# Patient Record
Sex: Male | Born: 1960 | Race: White | Hispanic: No | Marital: Married
Health system: Southern US, Community
[De-identification: ages and names within clinical notes are randomized; demographics above are authoritative.]

---

## 2013-02-21 ENCOUNTER — Other Ambulatory Visit: Payer: Self-pay | Admitting: Family Medicine

## 2013-02-21 DIAGNOSIS — Z1211 Encounter for screening for malignant neoplasm of colon: Secondary | ICD-10-CM

## 2013-03-20 ENCOUNTER — Ambulatory Visit
Admission: RE | Admit: 2013-03-20 | Discharge: 2013-03-20 | Disposition: A | Discharge status: Home / Self Care | Payer: PRIVATE HEALTH INSURANCE | Source: Ambulatory Visit | Attending: Family Medicine | Admitting: Family Medicine

## 2013-03-20 DIAGNOSIS — Z1211 Encounter for screening for malignant neoplasm of colon: Secondary | ICD-10-CM

## 2015-03-07 IMAGING — CT CT VIRTUAL COLONOSCOPY SCREENING
3 of 7 series · 13 of 46 positions shown, 19 images · non-contrast
Comparison: None.

CLINICAL DATA: Colon cancer screening

EXAM:
CT VIRTUAL COLONOSCOPY SCREENING
TECHNIQUE: The patient was given a standard mag citrate bowel preparation with
Gastrografin and barium for fluid and stool tagging respectively.
The quality of the bowel preparation is moderate. Automated CO2
insufflation of the colon was performed prior to image acquisition
and colonic distention is excellent. Image post processing was used
to generate a 3D endoluminal fly-through projection of the colon and
to electronically subtract stool/fluid as appropriate.

[Series 5: prone (id) · axial · 0.70mm/px · z∈[-497,-97]mm · 9 of 402 slices shown, 15 images]
[im 41/402  soft-tissue]
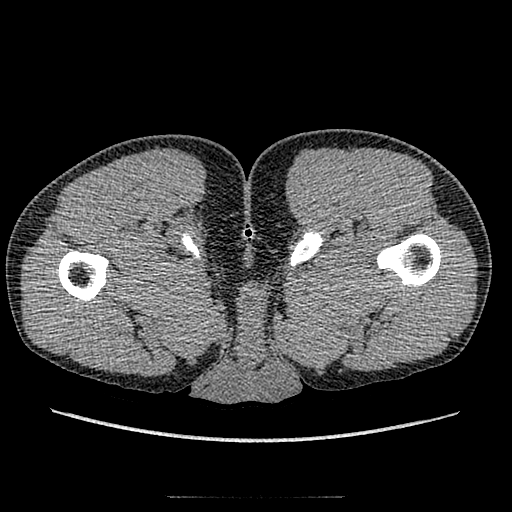
[im 41/402  bone]
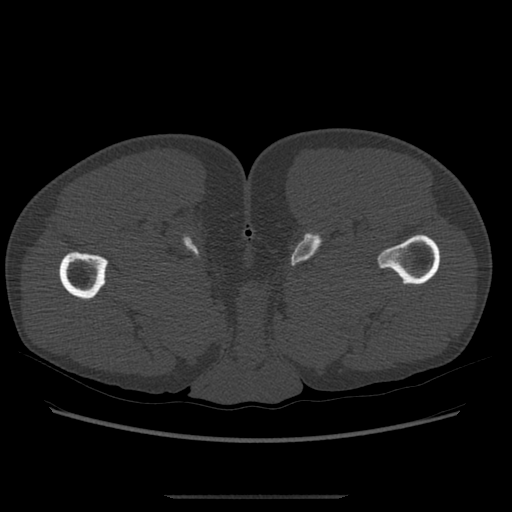
[im 81/402  soft-tissue]
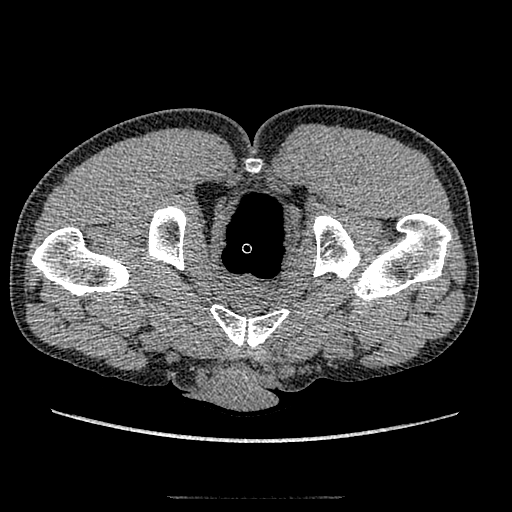
[im 121/402  soft-tissue]
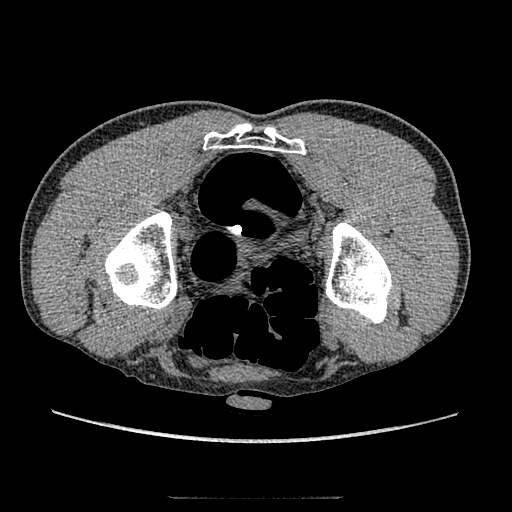
[im 161/402  soft-tissue]
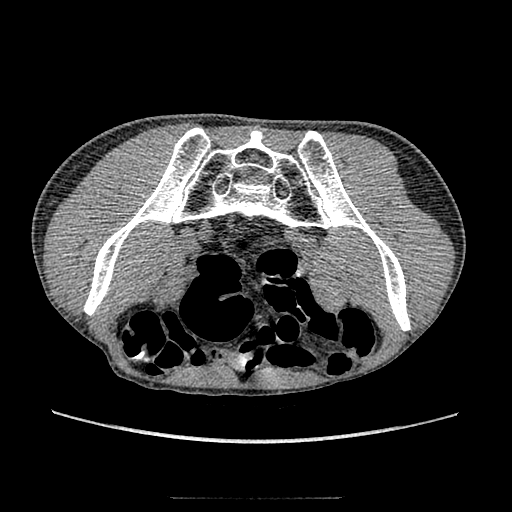
[im 201/402  soft-tissue]
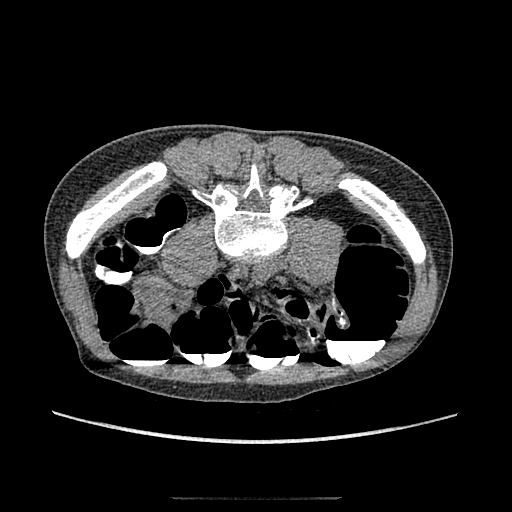
[im 241/402  soft-tissue]
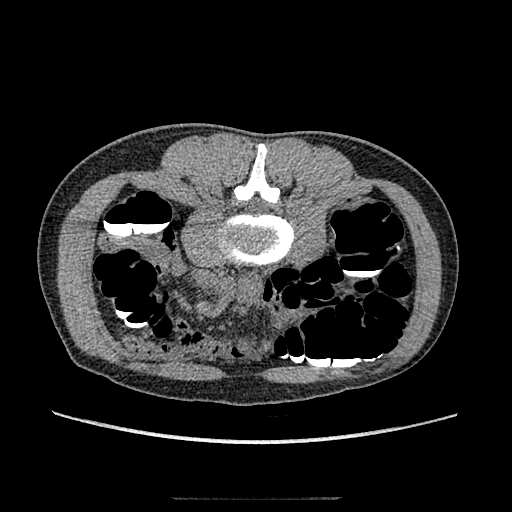
[im 241/402  lung]
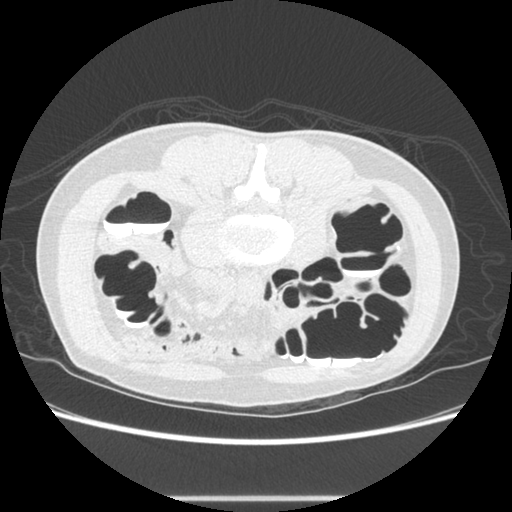
[im 281/402  soft-tissue]
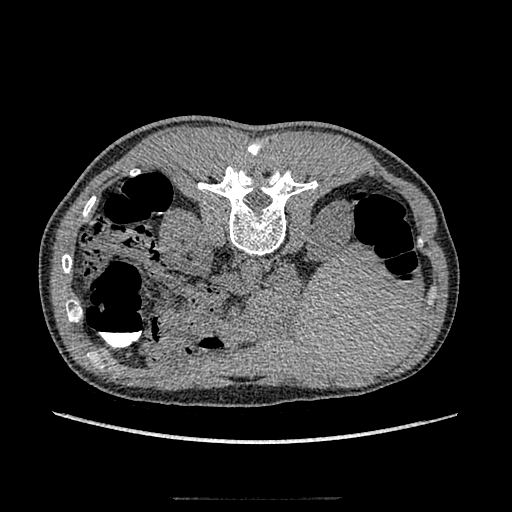
[im 281/402  lung]
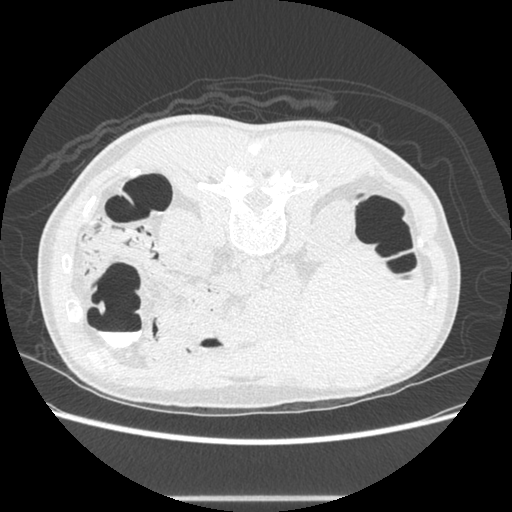
[im 321/402  soft-tissue]
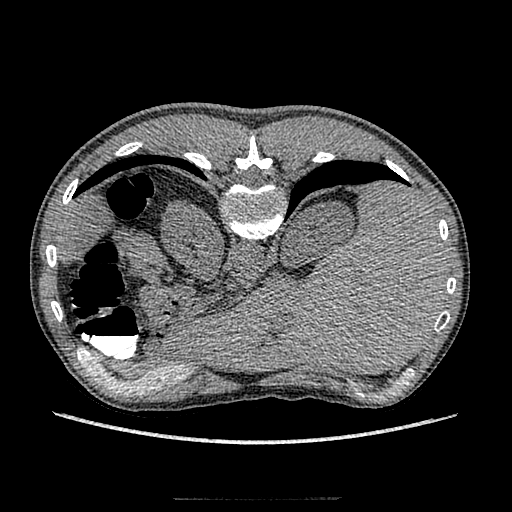
[im 321/402  lung]
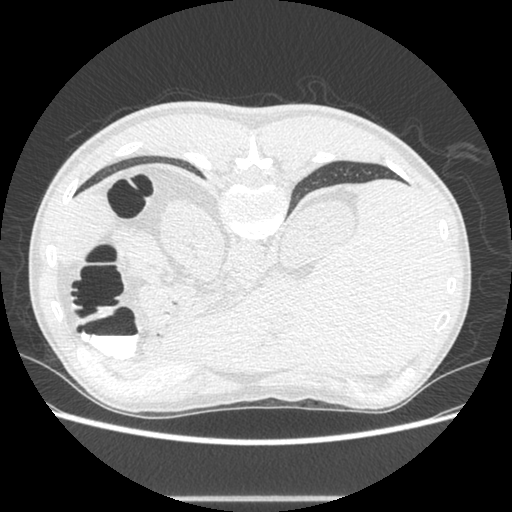
[im 361/402  soft-tissue]
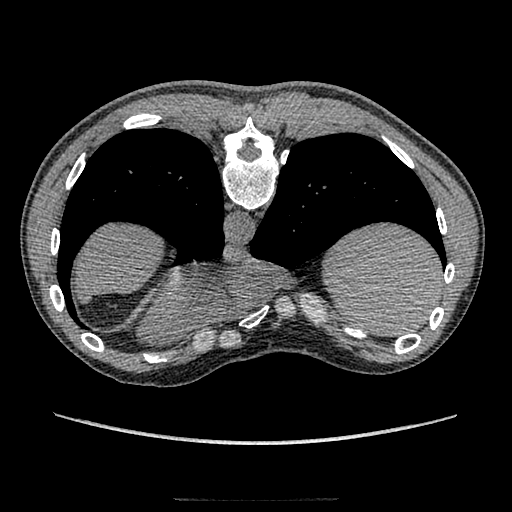
[im 361/402  lung]
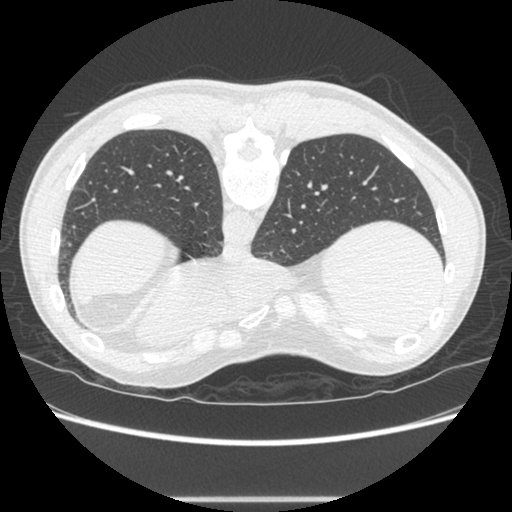
[im 361/402  bone]
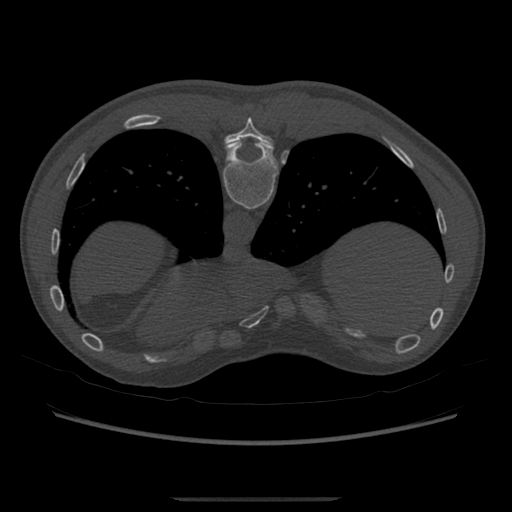

[Series 200: cor · coronal · 0.95mm/px · 3 of 131 slices shown]
[im 44/131  soft-tissue]
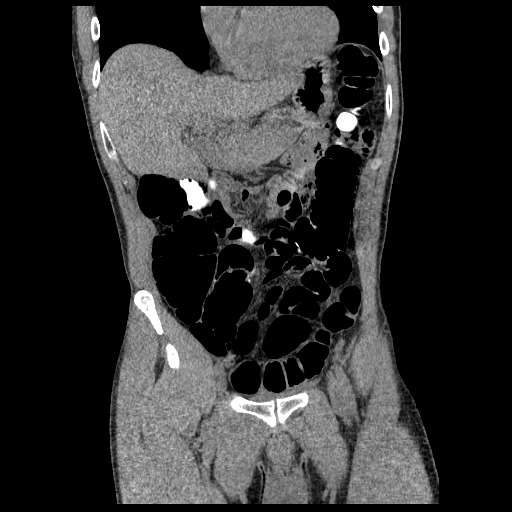
[im 58/131  soft-tissue]
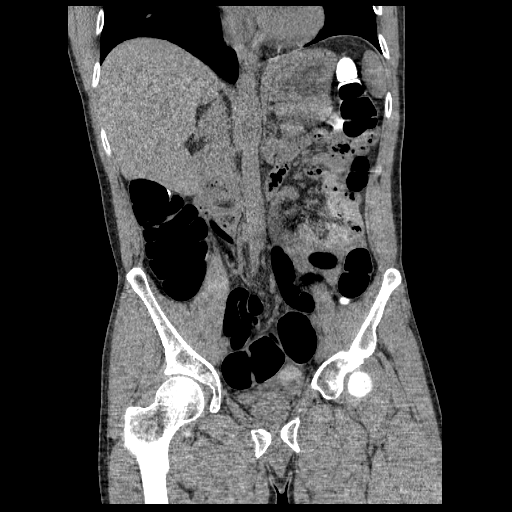
[im 73/131  soft-tissue]
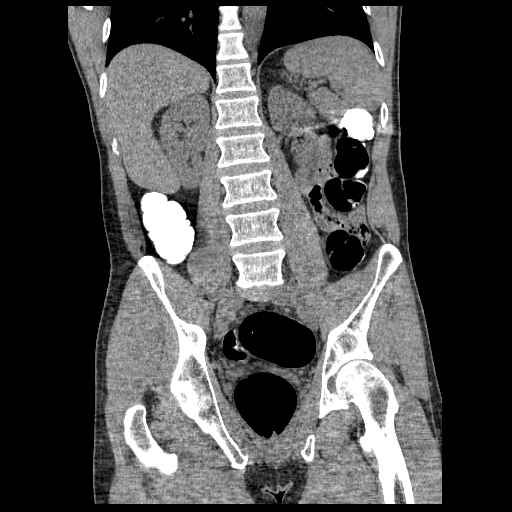

[Series 201: sag · sagittal · 0.95mm/px · 1 of 173 slices shown]
[im 58/173  soft-tissue]
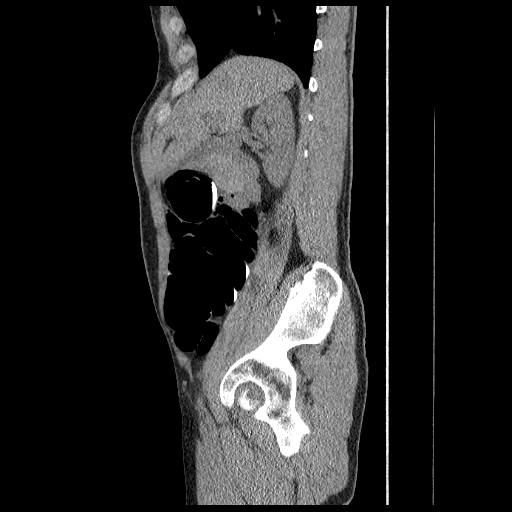

[13 of 46 positions shown; findings below may reference images not displayed]

FINDINGS: VIRTUAL COLONOSCOPY

Mild to moderate fluid layering within the lumen. However, this is
not preclude adequate evaluation of the entire colon when combining
supine and prone imaging.

No significant colonic polypoid lesion or mass. No apple core lesion
or stricture.

No evidence of bowel obstruction.  Normal appendix.

Virtual colonoscopy is not designed to detect diminutive polyps
(i.e., less than or equal to 5 mm), the presence or absence of which
may not affect clinical management.

CT ABDOMEN AND PELVIS WITHOUT CONTRAST

Lung bases are clear.

Unenhanced liver, spleen, pancreas, and adrenal glands are within
normal limits.

Gallbladder is unremarkable. No intrahepatic or extrahepatic ductal
dilatation.

Kidneys are within normal limits. No renal calculi or
hydronephrosis.

No evidence of abdominal aortic aneurysm.

No abdominopelvic ascites.

No suspicious abdominopelvic lymphadenopathy.

Prostate is unremarkable.

Bladder is underdistended.

Visualized osseous structures are within normal limits.
IMPRESSION: No significant colonic polypoid lesion, mass, or stricture.

Unremarkable unenhanced CT abdomen/pelvis.

## 2015-06-20 DIAGNOSIS — L820 Inflamed seborrheic keratosis: Secondary | ICD-10-CM | POA: Diagnosis not present

## 2015-06-20 DIAGNOSIS — D1801 Hemangioma of skin and subcutaneous tissue: Secondary | ICD-10-CM | POA: Diagnosis not present

## 2015-06-20 DIAGNOSIS — L570 Actinic keratosis: Secondary | ICD-10-CM | POA: Diagnosis not present

## 2015-06-21 DIAGNOSIS — Z0000 Encounter for general adult medical examination without abnormal findings: Secondary | ICD-10-CM | POA: Diagnosis not present

## 2015-06-21 DIAGNOSIS — Z125 Encounter for screening for malignant neoplasm of prostate: Secondary | ICD-10-CM | POA: Diagnosis not present

## 2015-06-21 DIAGNOSIS — E785 Hyperlipidemia, unspecified: Secondary | ICD-10-CM | POA: Diagnosis not present

## 2016-01-06 DIAGNOSIS — D1801 Hemangioma of skin and subcutaneous tissue: Secondary | ICD-10-CM | POA: Diagnosis not present

## 2016-01-06 DIAGNOSIS — D235 Other benign neoplasm of skin of trunk: Secondary | ICD-10-CM | POA: Diagnosis not present

## 2016-01-06 DIAGNOSIS — L570 Actinic keratosis: Secondary | ICD-10-CM | POA: Diagnosis not present

## 2016-01-06 DIAGNOSIS — L814 Other melanin hyperpigmentation: Secondary | ICD-10-CM | POA: Diagnosis not present

## 2016-01-06 DIAGNOSIS — L821 Other seborrheic keratosis: Secondary | ICD-10-CM | POA: Diagnosis not present

## 2016-05-18 DIAGNOSIS — H1032 Unspecified acute conjunctivitis, left eye: Secondary | ICD-10-CM | POA: Diagnosis not present

## 2016-06-25 DIAGNOSIS — E785 Hyperlipidemia, unspecified: Secondary | ICD-10-CM | POA: Diagnosis not present

## 2016-06-25 DIAGNOSIS — Z125 Encounter for screening for malignant neoplasm of prostate: Secondary | ICD-10-CM | POA: Diagnosis not present

## 2016-06-25 DIAGNOSIS — Z1211 Encounter for screening for malignant neoplasm of colon: Secondary | ICD-10-CM | POA: Diagnosis not present

## 2016-06-25 DIAGNOSIS — Z0000 Encounter for general adult medical examination without abnormal findings: Secondary | ICD-10-CM | POA: Diagnosis not present

## 2016-08-24 DIAGNOSIS — D225 Melanocytic nevi of trunk: Secondary | ICD-10-CM | POA: Diagnosis not present

## 2016-08-24 DIAGNOSIS — L814 Other melanin hyperpigmentation: Secondary | ICD-10-CM | POA: Diagnosis not present

## 2016-08-24 DIAGNOSIS — D1801 Hemangioma of skin and subcutaneous tissue: Secondary | ICD-10-CM | POA: Diagnosis not present

## 2016-08-24 DIAGNOSIS — L821 Other seborrheic keratosis: Secondary | ICD-10-CM | POA: Diagnosis not present

## 2017-06-30 DIAGNOSIS — E785 Hyperlipidemia, unspecified: Secondary | ICD-10-CM | POA: Diagnosis not present

## 2017-06-30 DIAGNOSIS — Z0000 Encounter for general adult medical examination without abnormal findings: Secondary | ICD-10-CM | POA: Diagnosis not present

## 2017-06-30 DIAGNOSIS — Z1211 Encounter for screening for malignant neoplasm of colon: Secondary | ICD-10-CM | POA: Diagnosis not present

## 2017-06-30 DIAGNOSIS — Z125 Encounter for screening for malignant neoplasm of prostate: Secondary | ICD-10-CM | POA: Diagnosis not present

## 2017-08-10 DIAGNOSIS — R195 Other fecal abnormalities: Secondary | ICD-10-CM | POA: Diagnosis not present

## 2017-08-24 DIAGNOSIS — L570 Actinic keratosis: Secondary | ICD-10-CM | POA: Diagnosis not present

## 2017-08-24 DIAGNOSIS — D229 Melanocytic nevi, unspecified: Secondary | ICD-10-CM | POA: Diagnosis not present

## 2017-08-24 DIAGNOSIS — L821 Other seborrheic keratosis: Secondary | ICD-10-CM | POA: Diagnosis not present

## 2017-08-24 DIAGNOSIS — Z85820 Personal history of malignant melanoma of skin: Secondary | ICD-10-CM | POA: Diagnosis not present

## 2017-08-24 DIAGNOSIS — L814 Other melanin hyperpigmentation: Secondary | ICD-10-CM | POA: Diagnosis not present

## 2017-08-26 DIAGNOSIS — R195 Other fecal abnormalities: Secondary | ICD-10-CM | POA: Diagnosis not present

## 2017-08-26 DIAGNOSIS — K640 First degree hemorrhoids: Secondary | ICD-10-CM | POA: Diagnosis not present

## 2018-01-13 DIAGNOSIS — Z1211 Encounter for screening for malignant neoplasm of colon: Secondary | ICD-10-CM | POA: Diagnosis not present

## 2018-02-15 DIAGNOSIS — K921 Melena: Secondary | ICD-10-CM | POA: Diagnosis not present

## 2018-02-24 DIAGNOSIS — L570 Actinic keratosis: Secondary | ICD-10-CM | POA: Diagnosis not present

## 2018-02-24 DIAGNOSIS — L819 Disorder of pigmentation, unspecified: Secondary | ICD-10-CM | POA: Diagnosis not present

## 2018-02-24 DIAGNOSIS — Z85820 Personal history of malignant melanoma of skin: Secondary | ICD-10-CM | POA: Diagnosis not present

## 2018-07-21 DIAGNOSIS — Z0000 Encounter for general adult medical examination without abnormal findings: Secondary | ICD-10-CM | POA: Diagnosis not present

## 2018-07-29 DIAGNOSIS — E785 Hyperlipidemia, unspecified: Secondary | ICD-10-CM | POA: Diagnosis not present

## 2018-07-29 DIAGNOSIS — Z0000 Encounter for general adult medical examination without abnormal findings: Secondary | ICD-10-CM | POA: Diagnosis not present

## 2018-07-29 DIAGNOSIS — R7309 Other abnormal glucose: Secondary | ICD-10-CM | POA: Diagnosis not present

## 2018-07-29 DIAGNOSIS — Z125 Encounter for screening for malignant neoplasm of prostate: Secondary | ICD-10-CM | POA: Diagnosis not present

## 2018-08-25 DIAGNOSIS — D1801 Hemangioma of skin and subcutaneous tissue: Secondary | ICD-10-CM | POA: Diagnosis not present

## 2018-08-25 DIAGNOSIS — L819 Disorder of pigmentation, unspecified: Secondary | ICD-10-CM | POA: Diagnosis not present

## 2018-08-25 DIAGNOSIS — D229 Melanocytic nevi, unspecified: Secondary | ICD-10-CM | POA: Diagnosis not present

## 2018-08-25 DIAGNOSIS — L814 Other melanin hyperpigmentation: Secondary | ICD-10-CM | POA: Diagnosis not present

## 2018-08-25 DIAGNOSIS — L570 Actinic keratosis: Secondary | ICD-10-CM | POA: Diagnosis not present

## 2019-02-20 DIAGNOSIS — L821 Other seborrheic keratosis: Secondary | ICD-10-CM | POA: Diagnosis not present

## 2019-02-20 DIAGNOSIS — D1801 Hemangioma of skin and subcutaneous tissue: Secondary | ICD-10-CM | POA: Diagnosis not present

## 2019-02-20 DIAGNOSIS — L570 Actinic keratosis: Secondary | ICD-10-CM | POA: Diagnosis not present

## 2019-02-20 DIAGNOSIS — L905 Scar conditions and fibrosis of skin: Secondary | ICD-10-CM | POA: Diagnosis not present

## 2019-02-20 DIAGNOSIS — D229 Melanocytic nevi, unspecified: Secondary | ICD-10-CM | POA: Diagnosis not present

## 2019-03-23 DIAGNOSIS — L570 Actinic keratosis: Secondary | ICD-10-CM | POA: Diagnosis not present

## 2019-03-23 DIAGNOSIS — L819 Disorder of pigmentation, unspecified: Secondary | ICD-10-CM | POA: Diagnosis not present

## 2019-07-31 DIAGNOSIS — E785 Hyperlipidemia, unspecified: Secondary | ICD-10-CM | POA: Diagnosis not present

## 2019-07-31 DIAGNOSIS — Z0000 Encounter for general adult medical examination without abnormal findings: Secondary | ICD-10-CM | POA: Diagnosis not present

## 2019-07-31 DIAGNOSIS — R7309 Other abnormal glucose: Secondary | ICD-10-CM | POA: Diagnosis not present

## 2019-07-31 DIAGNOSIS — Z125 Encounter for screening for malignant neoplasm of prostate: Secondary | ICD-10-CM | POA: Diagnosis not present

## 2019-09-20 DIAGNOSIS — L821 Other seborrheic keratosis: Secondary | ICD-10-CM | POA: Diagnosis not present

## 2019-09-20 DIAGNOSIS — L570 Actinic keratosis: Secondary | ICD-10-CM | POA: Diagnosis not present

## 2019-09-20 DIAGNOSIS — D225 Melanocytic nevi of trunk: Secondary | ICD-10-CM | POA: Diagnosis not present

## 2019-09-25 DIAGNOSIS — E785 Hyperlipidemia, unspecified: Secondary | ICD-10-CM | POA: Diagnosis not present

## 2020-03-25 DIAGNOSIS — D229 Melanocytic nevi, unspecified: Secondary | ICD-10-CM | POA: Diagnosis not present

## 2020-03-25 DIAGNOSIS — L821 Other seborrheic keratosis: Secondary | ICD-10-CM | POA: Diagnosis not present

## 2020-03-25 DIAGNOSIS — L814 Other melanin hyperpigmentation: Secondary | ICD-10-CM | POA: Diagnosis not present

## 2020-03-25 DIAGNOSIS — L570 Actinic keratosis: Secondary | ICD-10-CM | POA: Diagnosis not present

## 2020-03-25 DIAGNOSIS — L905 Scar conditions and fibrosis of skin: Secondary | ICD-10-CM | POA: Diagnosis not present

## 2020-09-03 DIAGNOSIS — E785 Hyperlipidemia, unspecified: Secondary | ICD-10-CM | POA: Diagnosis not present

## 2020-09-03 DIAGNOSIS — Z0000 Encounter for general adult medical examination without abnormal findings: Secondary | ICD-10-CM | POA: Diagnosis not present

## 2020-09-03 DIAGNOSIS — Z125 Encounter for screening for malignant neoplasm of prostate: Secondary | ICD-10-CM | POA: Diagnosis not present

## 2020-09-03 DIAGNOSIS — R7309 Other abnormal glucose: Secondary | ICD-10-CM | POA: Diagnosis not present

## 2020-09-23 DIAGNOSIS — L905 Scar conditions and fibrosis of skin: Secondary | ICD-10-CM | POA: Diagnosis not present

## 2020-09-23 DIAGNOSIS — L819 Disorder of pigmentation, unspecified: Secondary | ICD-10-CM | POA: Diagnosis not present

## 2020-09-23 DIAGNOSIS — L570 Actinic keratosis: Secondary | ICD-10-CM | POA: Diagnosis not present

## 2020-09-23 DIAGNOSIS — Z85820 Personal history of malignant melanoma of skin: Secondary | ICD-10-CM | POA: Diagnosis not present

## 2021-05-26 DIAGNOSIS — L814 Other melanin hyperpigmentation: Secondary | ICD-10-CM | POA: Diagnosis not present

## 2021-05-26 DIAGNOSIS — D492 Neoplasm of unspecified behavior of bone, soft tissue, and skin: Secondary | ICD-10-CM | POA: Diagnosis not present

## 2021-05-26 DIAGNOSIS — L821 Other seborrheic keratosis: Secondary | ICD-10-CM | POA: Diagnosis not present

## 2021-05-26 DIAGNOSIS — D225 Melanocytic nevi of trunk: Secondary | ICD-10-CM | POA: Diagnosis not present

## 2021-05-26 DIAGNOSIS — L570 Actinic keratosis: Secondary | ICD-10-CM | POA: Diagnosis not present

## 2021-09-22 ENCOUNTER — Other Ambulatory Visit (HOSPITAL_BASED_OUTPATIENT_CLINIC_OR_DEPARTMENT_OTHER): Payer: Self-pay | Admitting: Family Medicine

## 2021-09-22 ENCOUNTER — Other Ambulatory Visit: Payer: Self-pay | Admitting: Family Medicine

## 2021-09-22 DIAGNOSIS — E785 Hyperlipidemia, unspecified: Secondary | ICD-10-CM

## 2022-09-30 DIAGNOSIS — Z0000 Encounter for general adult medical examination without abnormal findings: Secondary | ICD-10-CM | POA: Diagnosis not present

## 2022-09-30 DIAGNOSIS — E785 Hyperlipidemia, unspecified: Secondary | ICD-10-CM | POA: Diagnosis not present

## 2022-09-30 DIAGNOSIS — Z125 Encounter for screening for malignant neoplasm of prostate: Secondary | ICD-10-CM | POA: Diagnosis not present

## 2022-11-02 DIAGNOSIS — L814 Other melanin hyperpigmentation: Secondary | ICD-10-CM | POA: Diagnosis not present

## 2022-11-02 DIAGNOSIS — D225 Melanocytic nevi of trunk: Secondary | ICD-10-CM | POA: Diagnosis not present

## 2022-11-02 DIAGNOSIS — L570 Actinic keratosis: Secondary | ICD-10-CM | POA: Diagnosis not present

## 2022-11-02 DIAGNOSIS — L821 Other seborrheic keratosis: Secondary | ICD-10-CM | POA: Diagnosis not present

## 2022-11-02 DIAGNOSIS — Z08 Encounter for follow-up examination after completed treatment for malignant neoplasm: Secondary | ICD-10-CM | POA: Diagnosis not present

## 2023-05-03 DIAGNOSIS — D2261 Melanocytic nevi of right upper limb, including shoulder: Secondary | ICD-10-CM | POA: Diagnosis not present

## 2023-05-03 DIAGNOSIS — D225 Melanocytic nevi of trunk: Secondary | ICD-10-CM | POA: Diagnosis not present

## 2023-05-03 DIAGNOSIS — D2262 Melanocytic nevi of left upper limb, including shoulder: Secondary | ICD-10-CM | POA: Diagnosis not present

## 2023-05-03 DIAGNOSIS — D2239 Melanocytic nevi of other parts of face: Secondary | ICD-10-CM | POA: Diagnosis not present

## 2023-05-03 DIAGNOSIS — L570 Actinic keratosis: Secondary | ICD-10-CM | POA: Diagnosis not present

## 2023-10-08 DIAGNOSIS — Z125 Encounter for screening for malignant neoplasm of prostate: Secondary | ICD-10-CM | POA: Diagnosis not present

## 2023-10-08 DIAGNOSIS — E785 Hyperlipidemia, unspecified: Secondary | ICD-10-CM | POA: Diagnosis not present

## 2023-10-08 DIAGNOSIS — Z0000 Encounter for general adult medical examination without abnormal findings: Secondary | ICD-10-CM | POA: Diagnosis not present

## 2023-10-08 DIAGNOSIS — N529 Male erectile dysfunction, unspecified: Secondary | ICD-10-CM | POA: Diagnosis not present

## 2023-11-03 DIAGNOSIS — L578 Other skin changes due to chronic exposure to nonionizing radiation: Secondary | ICD-10-CM | POA: Diagnosis not present

## 2023-11-03 DIAGNOSIS — L570 Actinic keratosis: Secondary | ICD-10-CM | POA: Diagnosis not present

## 2023-11-03 DIAGNOSIS — D0439 Carcinoma in situ of skin of other parts of face: Secondary | ICD-10-CM | POA: Diagnosis not present

## 2023-11-03 DIAGNOSIS — L814 Other melanin hyperpigmentation: Secondary | ICD-10-CM | POA: Diagnosis not present

## 2023-11-03 DIAGNOSIS — L821 Other seborrheic keratosis: Secondary | ICD-10-CM | POA: Diagnosis not present

## 2023-11-03 DIAGNOSIS — Z08 Encounter for follow-up examination after completed treatment for malignant neoplasm: Secondary | ICD-10-CM | POA: Diagnosis not present

## 2023-11-03 DIAGNOSIS — D492 Neoplasm of unspecified behavior of bone, soft tissue, and skin: Secondary | ICD-10-CM | POA: Diagnosis not present

## 2023-11-22 DIAGNOSIS — D0439 Carcinoma in situ of skin of other parts of face: Secondary | ICD-10-CM | POA: Diagnosis not present
